# Patient Record
Sex: Male | Born: 1990 | Race: White | Hispanic: Yes | Marital: Married | Smoking: Never smoker
Health system: Southern US, Community
[De-identification: ages and names within clinical notes are randomized; demographics above are authoritative.]

## PROBLEM LIST (undated history)

## (undated) DIAGNOSIS — C8590 Non-Hodgkin lymphoma, unspecified, unspecified site: Secondary | ICD-10-CM

## (undated) HISTORY — PX: NECK SURGERY: SHX720

---

## 2017-08-18 ENCOUNTER — Emergency Department
Admission: EM | Admit: 2017-08-18 | Discharge: 2017-08-18 | Disposition: A | Discharge status: Home / Self Care | Payer: No Typology Code available for payment source | Attending: Emergency Medicine | Admitting: Emergency Medicine

## 2017-08-18 ENCOUNTER — Emergency Department: Payer: No Typology Code available for payment source

## 2017-08-18 ENCOUNTER — Other Ambulatory Visit: Payer: Self-pay

## 2017-08-18 ENCOUNTER — Encounter: Payer: Self-pay | Admitting: Emergency Medicine

## 2017-08-18 DIAGNOSIS — Y9389 Activity, other specified: Secondary | ICD-10-CM | POA: Diagnosis not present

## 2017-08-18 DIAGNOSIS — Y92410 Unspecified street and highway as the place of occurrence of the external cause: Secondary | ICD-10-CM | POA: Insufficient documentation

## 2017-08-18 DIAGNOSIS — R0789 Other chest pain: Secondary | ICD-10-CM | POA: Insufficient documentation

## 2017-08-18 DIAGNOSIS — M25512 Pain in left shoulder: Secondary | ICD-10-CM | POA: Insufficient documentation

## 2017-08-18 DIAGNOSIS — Y999 Unspecified external cause status: Secondary | ICD-10-CM | POA: Diagnosis not present

## 2017-08-18 DIAGNOSIS — S199XXA Unspecified injury of neck, initial encounter: Secondary | ICD-10-CM | POA: Diagnosis present

## 2017-08-18 DIAGNOSIS — M25522 Pain in left elbow: Secondary | ICD-10-CM | POA: Insufficient documentation

## 2017-08-18 DIAGNOSIS — C8590 Non-Hodgkin lymphoma, unspecified, unspecified site: Secondary | ICD-10-CM | POA: Insufficient documentation

## 2017-08-18 DIAGNOSIS — S161XXA Strain of muscle, fascia and tendon at neck level, initial encounter: Secondary | ICD-10-CM | POA: Diagnosis not present

## 2017-08-18 HISTORY — DX: Non-Hodgkin lymphoma, unspecified, unspecified site: C85.90

## 2017-08-18 MED ORDER — DIAZEPAM 5 MG PO TABS: 5.0000 mg | ORAL_TABLET | Freq: Three times a day (TID) | ORAL | 0 refills | Status: AC | PRN

## 2017-08-18 MED ORDER — OXYCODONE-ACETAMINOPHEN 5-325 MG PO TABS
1.0000 | ORAL_TABLET | Freq: Once | ORAL | Status: AC
  Administered 2017-08-18: 1 via ORAL
  Filled 2017-08-18: qty 1

## 2017-08-18 MED ORDER — IBUPROFEN 800 MG PO TABS: 800.0000 mg | ORAL_TABLET | Freq: Three times a day (TID) | ORAL | 0 refills | Status: DC | PRN

## 2017-08-18 MED ORDER — KETOROLAC TROMETHAMINE 60 MG/2ML IM SOLN
60.0000 mg | Freq: Once | INTRAMUSCULAR | Status: AC
  Administered 2017-08-18: 60 mg via INTRAMUSCULAR
  Filled 2017-08-18: qty 2

## 2017-08-18 MED ORDER — DIAZEPAM 2 MG PO TABS
2.0000 mg | ORAL_TABLET | Freq: Once | ORAL | Status: AC
  Administered 2017-08-18: 2 mg via ORAL
  Filled 2017-08-18: qty 1

## 2017-08-18 NOTE — ED Notes (Signed)
Patient able to ambulate to wheelchair without assistance. NAD and steady noted. Patient verbalized understanding of discharge instructions and follow-up care.
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
## 2017-08-18 NOTE — Discharge Instructions (Addendum)
You may take Tylenol or Motrin for mild to moderate pain.  Valium is for severe muscle spasms.  Do not drive within 8 hours of taking Valium.  Your sling for comfort, but make sure you take your shoulder and elbow out of the sling and move them around at least once per hour to prevent frozen shoulder.  Return to the emergency department for severe pain, vomiting, numbness tingling or weakness, or any other symptoms concerning to you.
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
## 2017-09-06 ENCOUNTER — Other Ambulatory Visit: Payer: Self-pay | Admitting: Orthopedic Surgery

## 2017-09-06 DIAGNOSIS — M5412 Radiculopathy, cervical region: Secondary | ICD-10-CM

## 2017-09-22 ENCOUNTER — Ambulatory Visit
Admission: RE | Admit: 2017-09-22 | Discharge: 2017-09-22 | Disposition: A | Discharge status: Home / Self Care | Payer: No Typology Code available for payment source | Source: Ambulatory Visit | Attending: Orthopedic Surgery | Admitting: Orthopedic Surgery

## 2017-09-22 DIAGNOSIS — M5412 Radiculopathy, cervical region: Secondary | ICD-10-CM | POA: Insufficient documentation

## 2017-09-22 DIAGNOSIS — M50222 Other cervical disc displacement at C5-C6 level: Secondary | ICD-10-CM | POA: Diagnosis not present

## 2019-03-20 ENCOUNTER — Other Ambulatory Visit: Payer: Self-pay

## 2019-03-20 DIAGNOSIS — Z20822 Contact with and (suspected) exposure to covid-19: Secondary | ICD-10-CM

## 2019-03-21 LAB — NOVEL CORONAVIRUS, NAA: SARS-CoV-2, NAA: NOT DETECTED
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
## 2021-08-24 ENCOUNTER — Other Ambulatory Visit: Payer: Self-pay

## 2021-08-24 ENCOUNTER — Encounter: Payer: Self-pay | Admitting: Emergency Medicine

## 2021-08-24 ENCOUNTER — Ambulatory Visit
Admission: EM | Admit: 2021-08-24 | Discharge: 2021-08-24 | Disposition: A | Discharge status: Home / Self Care | Payer: 59 | Attending: Emergency Medicine | Admitting: Emergency Medicine

## 2021-08-24 DIAGNOSIS — B349 Viral infection, unspecified: Secondary | ICD-10-CM

## 2021-08-24 LAB — POCT INFLUENZA A/B
Influenza A, POC: NEGATIVE
Influenza B, POC: NEGATIVE

## 2021-08-24 MED ORDER — IBUPROFEN 600 MG PO TABS: 600.0000 mg | ORAL_TABLET | Freq: Four times a day (QID) | ORAL | 0 refills | Status: DC | PRN

## 2021-08-24 MED ORDER — BENZONATATE 100 MG PO CAPS: 100.0000 mg | ORAL_CAPSULE | Freq: Three times a day (TID) | ORAL | 0 refills | Status: DC | PRN

## 2021-08-24 NOTE — ED Provider Notes (Signed)
[facility]    CSN: [id number] Arrival date & time: 08/24/21  1209      History   Chief Complaint Chief Complaint  Patient presents with   Nasal Congestion   Cough   Generalized Body Aches    HPI Marc Owens is a 31 y.o. male.  Patient presents with 3 day history of body aches, congestion, postnasal drip, runny nose, cough.  He also reports mild diarrhea.  He denies fever, shortness of breath, vomiting, or other symptoms.  Treatment at home with Robitussin.  His medical history includes non-hodgkin lymphoma.    The history is provided by the patient.   Past Medical History:  Diagnosis Date   Non-Hodgkin lymphoma ([place])     There are no problems to display for this patient.   Past Surgical History:  Procedure Laterality Date   NECK SURGERY         Home Medications    Prior to Admission medications   Medication Sig Start Date End Date Taking? Authorizing Provider  benzonatate (TESSALON) 100 MG capsule Take 1 capsule (100 mg total) by mouth 3 (three) times daily as needed for cough. 08/24/21  Yes Sharion Balloon, NP  ibuprofen (ADVIL) 600 MG tablet Take 1 tablet (600 mg total) by mouth every 6 (six) hours as needed. 08/24/21  Yes Sharion Balloon, NP    Family History History reviewed. No pertinent family history.  Social History Social History   Tobacco Use   Smoking status: Never  Substance Use Topics   Alcohol use: Yes    Comment: occasional   Drug use: No     Allergies   Patient has no known allergies.   Review of Systems Review of Systems  Constitutional:  Negative for chills and fever.  HENT:  Positive for congestion, postnasal drip, rhinorrhea and sinus pressure. Negative for ear pain and sore throat.   Respiratory:  Positive for cough. Negative for shortness of breath.   Cardiovascular:  Negative for chest pain and palpitations.  Gastrointestinal:  Negative for diarrhea and vomiting.  Skin:  Negative for color change and rash.   Neurological:  Negative for syncope.  All other systems reviewed and are negative.   Physical Exam Triage Vital Signs ED Triage Vitals  Enc Vitals Group     BP 08/24/21 1320 129/83     Pulse Rate 08/24/21 1320 95     Resp 08/24/21 1320 18     Temp 08/24/21 1320 98.2 F (36.8 C)     Temp Source 08/24/21 1320 Oral     SpO2 08/24/21 1320 98 %     Weight --      Height --      Head Circumference --      Peak Flow --      Pain Score 08/24/21 1331 5     Pain Loc --      Pain Edu? --      Excl. in [place]? --    No data found.  Updated Vital Signs BP 129/83 (BP Location: Left Arm)    Pulse 95    Temp 98.2 F (36.8 C) (Oral)    Resp 18    SpO2 98%   Visual Acuity Right Eye Distance:   Left Eye Distance:   Bilateral Distance:    Right Eye Near:   Left Eye Near:    Bilateral Near:     Physical Exam Vitals and nursing note reviewed.  Constitutional:  General: He is not in acute distress.    Appearance: He is well-developed.  HENT:     Right Ear: Tympanic membrane normal.     Left Ear: Tympanic membrane normal.     Nose: Congestion and rhinorrhea present.     Mouth/Throat:     Mouth: Mucous membranes are moist.     Pharynx: Oropharynx is clear.  Cardiovascular:     Rate and Rhythm: Normal rate and regular rhythm.     Heart sounds: Normal heart sounds.  Pulmonary:     Effort: Pulmonary effort is normal. No respiratory distress.     Breath sounds: Normal breath sounds.  Abdominal:     Palpations: Abdomen is soft.     Tenderness: There is no abdominal tenderness.  Musculoskeletal:     Cervical back: Neck supple.  Skin:    General: Skin is warm and dry.  Neurological:     Mental Status: He is alert.  Psychiatric:        Mood and Affect: Mood normal.        Behavior: Behavior normal.     UC Treatments / Results  Labs (all labs ordered are listed, but only abnormal results are displayed) Labs Reviewed  NOVEL CORONAVIRUS, NAA  POCT INFLUENZA A/B     EKG   Radiology No results found.  Procedures Procedures (including critical care time)  Medications Ordered in UC Medications - No data to display  Initial Impression / Assessment and Plan / UC Course  I have reviewed the triage vital signs and the nursing notes.  Pertinent labs & imaging results that were available during my care of the patient were reviewed by me and considered in my medical decision making (see chart for details).    Viral illness.  Rapid flu negative.  COVID pending.  Instructed patient to self quarantine per CDC guidelines.  Discussed symptomatic treatment including Tessalon Perles, ibuprofen, rest, hydration.  Instructed patient to follow up with PCP if symptoms are not improving.  Patient agrees to plan of care.   Final Clinical Impressions(s) / UC Diagnoses   Final diagnoses:  Viral illness     Discharge Instructions      Your flu test is negative.  Your COVID test is pending.  You should self quarantine until the test result is back.    Take ibuprofen and Tessalon Perles as directed.  Rest and keep yourself hydrated.    Follow-up with your primary care provider if your symptoms are not improving.         ED Prescriptions     Medication Sig Dispense Auth. Provider   benzonatate (TESSALON) 100 MG capsule Take 1 capsule (100 mg total) by mouth 3 (three) times daily as needed for cough. 21 capsule Barkley Boards H, NP   ibuprofen (ADVIL) 600 MG tablet Take 1 tablet (600 mg total) by mouth every 6 (six) hours as needed. 30 tablet Sharion Balloon, NP      PDMP not reviewed this encounter.   Sharion Balloon, NP 08/24/21 1423

## 2021-08-24 NOTE — ED Triage Notes (Signed)
Pt here with congestion, cough and body aches x 3 days. Denies fever.

## 2021-08-24 NOTE — Discharge Instructions (Addendum)
Your flu test is negative.  Your COVID test is pending.  You should self quarantine until the test result is back.    Take ibuprofen and Tessalon Perles as directed.  Rest and keep yourself hydrated.    Follow-up with your primary care provider if your symptoms are not improving.

## 2021-08-25 LAB — SARS-COV-2, NAA 2 DAY TAT

## 2021-08-25 LAB — NOVEL CORONAVIRUS, NAA: SARS-CoV-2, NAA: NOT DETECTED

## 2023-11-27 ENCOUNTER — Emergency Department
Admission: EM | Admit: 2023-11-27 | Discharge: 2023-11-27 | Disposition: A | Discharge status: Home / Self Care | Attending: Emergency Medicine | Admitting: Emergency Medicine

## 2023-11-27 ENCOUNTER — Other Ambulatory Visit: Payer: Self-pay

## 2023-11-27 DIAGNOSIS — R509 Fever, unspecified: Secondary | ICD-10-CM | POA: Diagnosis present

## 2023-11-27 DIAGNOSIS — B349 Viral infection, unspecified: Secondary | ICD-10-CM | POA: Diagnosis not present

## 2023-11-27 LAB — RESP PANEL BY RT-PCR (RSV, FLU A&B, COVID)  RVPGX2
Influenza A by PCR: NEGATIVE
Influenza B by PCR: NEGATIVE
Resp Syncytial Virus by PCR: NEGATIVE
SARS Coronavirus 2 by RT PCR: NEGATIVE

## 2023-11-27 MED ORDER — BENZONATATE 100 MG PO CAPS: 100.0000 mg | ORAL_CAPSULE | Freq: Three times a day (TID) | ORAL | 0 refills | Status: AC | PRN

## 2023-11-27 NOTE — ED Triage Notes (Signed)
 Pt to ed from home via POV for flu like symptoms. Pt just got back from [place] Last week. He has body aches, chills, fever. Pt states "I have had all of my vaccines". Pt is caox4, in no acute distress and ambulatory in triage,

## 2023-11-27 NOTE — ED Provider Notes (Signed)
 [facility] Provider Note    Event Date/Time   First MD Initiated Contact with Patient 11/27/23 1722     (approximate)   History   Flu Like symptoms   HPI  Marc Owens is a 33 y.o. male with PMH of non-Hodgkin lymphoma presents for evaluation of flu-like symptoms that began about 4 days ago.  Patient reports that he was recently in [place] for the funeral of a family member.  He states that his symptoms began after returning home.  He endorses cough, congestion, body aches and fever.  Patient's been taking Tylenol  at home to manage his symptoms.      Physical Exam   Triage Vital Signs: ED Triage Vitals [11/27/23 1708]  Encounter Vitals Group     BP 132/84     Systolic BP Percentile      Diastolic BP Percentile      Pulse Rate 95     Resp 18     Temp 99.1 F (37.3 C)     Temp Source Oral     SpO2 100 %     Weight      Height 5\' 5"  (1.651 m)     Head Circumference      Peak Flow      Pain Score 0     Pain Loc      Pain Education      Exclude from Growth Chart     Most recent vital signs: Vitals:   11/27/23 1708  BP: 132/84  Pulse: 95  Resp: 18  Temp: 99.1 F (37.3 C)  SpO2: 100%   General: Awake, no distress.  Does sound a bit congested. CV:  Good peripheral perfusion.  RRR. Resp:  Normal effort.  CTAB. Abd:  No distention.  Other:  Oral mucous membranes are moist, no pharyngeal erythema no tonsillar enlargement or exudates.   ED Results / Procedures / Treatments   Labs (all labs ordered are listed, but only abnormal results are displayed) Labs Reviewed  RESP PANEL BY RT-PCR (RSV, FLU A&B, COVID)  [id number]   PROCEDURES:  Critical Care performed: No  Procedures   MEDICATIONS ORDERED IN ED: Medications - No data to display   IMPRESSION / MDM / ASSESSMENT AND PLAN / ED COURSE  I reviewed the triage vital signs and the nursing notes.                             33 year old male presents for evaluation of  flulike symptoms.  Vital signs are stable and patient NAD on exam.  Differential diagnosis includes, but is not limited to, to, COVID, RSV, other viral illness.  Patient's presentation is most consistent with acute complicated illness / injury requiring diagnostic workup.  Patient tested negative for flu, covid and rsv. Did consider illnesses endemic to [place] like malaria and yellow fever, but patient states he received vaccines for these prior to his travel.   Patient's symptoms are consistent with a viral illness.  Discussed symptomatic management with him.  Recommended over-the-counter cold medicines as well as Tylenol  and ibuprofen  as needed. Given a note for work. We discussed return precautions.  He voiced understanding, all questions were answered and he was stable at discharge.      FINAL CLINICAL IMPRESSION(S) / ED DIAGNOSES   Final diagnoses:  Viral infection     Rx / DC Orders   ED Discharge Orders  Ordered    benzonatate  (TESSALON ) 100 MG capsule  3 times daily PRN        11/27/23 1918             Note:  This document was prepared using Dragon voice recognition software and may include unintentional dictation errors.   Phyliss Breen, PA-C 11/27/23 1925    Lind Repine, MD 11/29/23 [phone]

## 2023-11-27 NOTE — Discharge Instructions (Signed)
 You can take the tessalon  perles as needed for cough.  I believe you have a viral illness which will resolve on its own with time.  You do not need an antibiotic.  You can take over-the-counter cold medicine as needed to manage your symptoms. I recommend something like DayQuil or Theraflu. If you are taking combination cold medicine keep in mind that this often contains Tylenol  so if you need additional medication for body aches or fever control please take Motrin  or ibuprofen .  Your symptoms should resolve with time, if you have had symptoms for greater than 10 days please be evaluated by another healthcare provider as at this point it may have developed into a bacterial infection which requires a different treatment.  Return to the emergency department with worsening symptoms.

## 2024-06-08 ENCOUNTER — Ambulatory Visit
Admission: EM | Admit: 2024-06-08 | Discharge: 2024-06-08 | Disposition: A | Discharge status: Home / Self Care | Attending: Emergency Medicine | Admitting: Emergency Medicine

## 2024-06-08 ENCOUNTER — Encounter: Payer: Self-pay | Admitting: Emergency Medicine

## 2024-06-08 DIAGNOSIS — J069 Acute upper respiratory infection, unspecified: Secondary | ICD-10-CM

## 2024-06-08 MED ORDER — PROMETHAZINE-DM 6.25-15 MG/5ML PO SYRP: 5.0000 mL | ORAL_SOLUTION | Freq: Every evening | ORAL | 0 refills | Status: AC | PRN

## 2024-06-08 MED ORDER — BENZONATATE 100 MG PO CAPS: 100.0000 mg | ORAL_CAPSULE | Freq: Three times a day (TID) | ORAL | 0 refills | Status: AC

## 2024-06-08 MED ORDER — AZITHROMYCIN 250 MG PO TABS: 250.0000 mg | ORAL_TABLET | Freq: Every day | ORAL | 0 refills | Status: AC

## 2024-06-08 NOTE — ED Provider Notes (Signed)
 Marc Owens    CSN: [id number] Arrival date & time: 06/08/24  1632      History   Chief Complaint Chief Complaint  Patient presents with   Generalized Body Aches   Cough   Hoarse    HPI Marc Owens is a 33 y.o. male.   Patient presents for evaluation of a persisting cough present for 7 days.  Over the past week has had 1 occurrence of bilateral ear pain which have resolved, has been experiencing sinus pain and pressure to the bridge of the nose only in the mornings.  Over the past 3 days has become hoarse but denies sore throat.  Known sick contact but has different symptoms.  Endorses that he occasionally works outside and he feels this exacerbated symptoms.  Has not attempted treatment.  Denies fever, shortness of breath or wheezing.      Past Medical History:  Diagnosis Date   Non-Hodgkin lymphoma (HCC)     There are no active problems to display for this patient.   Past Surgical History:  Procedure Laterality Date   NECK SURGERY         Home Medications    Prior to Admission medications   Medication Sig Start Date End Date Taking? Authorizing Provider  ibuprofen  (ADVIL ) 600 MG tablet Take 1 tablet (600 mg total) by mouth every 6 (six) hours as needed. 08/24/21   Corlis Burnard DEL, NP    Family History History reviewed. No pertinent family history.  Social History Social History   Tobacco Use   Smoking status: Never  Substance Use Topics   Alcohol use: Yes    Comment: occasional   Drug use: No     Allergies   Patient has no known allergies.   Review of Systems Review of Systems  HENT:  Positive for ear pain, sinus pressure, sinus pain and voice change. Negative for congestion, dental problem, drooling, ear discharge, facial swelling, hearing loss, mouth sores, nosebleeds, postnasal drip, rhinorrhea, sneezing, sore throat, tinnitus and trouble swallowing.   Respiratory:  Positive for cough. Negative for apnea, choking, chest  tightness, shortness of breath, wheezing and stridor.   Cardiovascular: Negative.   Gastrointestinal: Negative.   Skin: Negative.   Neurological: Negative.      Physical Exam Triage Vital Signs ED Triage Vitals  Encounter Vitals Group     BP 06/08/24 1735 113/77     Girls Systolic BP Percentile --      Girls Diastolic BP Percentile --      Boys Systolic BP Percentile --      Boys Diastolic BP Percentile --      Pulse Rate 06/08/24 1735 75     Resp 06/08/24 1735 20     Temp 06/08/24 1735 98 F (36.7 C)     Temp Source 06/08/24 1735 Oral     SpO2 06/08/24 1735 98 %     Weight --      Height --      Head Circumference --      Peak Flow --      Pain Score 06/08/24 1737 5     Pain Loc --      Pain Education --      Exclude from Growth Chart --    No data found.  Updated Vital Signs BP 113/77 (BP Location: Left Arm)   Pulse 75   Temp 98 F (36.7 C) (Oral)   Resp 20   SpO2 98%   Visual  Acuity Right Eye Distance:   Left Eye Distance:   Bilateral Distance:    Right Eye Near:   Left Eye Near:    Bilateral Near:     Physical Exam Constitutional:      Appearance: Normal appearance.  HENT:     Right Ear: Tympanic membrane, ear canal and external ear normal.     Left Ear: Tympanic membrane, ear canal and external ear normal.     Nose: Congestion present.     Mouth/Throat:     Pharynx: Posterior oropharyngeal erythema present. No oropharyngeal exudate.  Eyes:     Extraocular Movements: Extraocular movements intact.  Cardiovascular:     Rate and Rhythm: Normal rate and regular rhythm.     Pulses: Normal pulses.     Heart sounds: Normal heart sounds.  Pulmonary:     Effort: Pulmonary effort is normal.     Breath sounds: Normal breath sounds.  Neurological:     Mental Status: He is alert and oriented to person, place, and time. Mental status is at baseline.      UC Treatments / Results  Labs (all labs ordered are listed, but only abnormal results are  displayed) Labs Reviewed - No data to display  EKG   Radiology No results found.  Procedures Procedures (including critical care time)  Medications Ordered in UC Medications - No data to display  Initial Impression / Assessment and Plan / UC Course  I have reviewed the triage vital signs and the nursing notes.  Pertinent labs & imaging results that were available during my care of the patient were reviewed by me and considered in my medical decision making (see chart for details).    Acute uri  Patient is in no signs of distress nor toxic appearing.  Vital signs are stable.  Low suspicion for pneumonia, pneumothorax or bronchitis and therefore will defer imaging viral testing deferred due to timeline, symptoms present for 7 days without signs of resolution empirically placed on azithromycin additionally prescribed Tessalon  and Promethazine DM for management of cough.May use additional over-the-counter medications as needed for supportive care.  May follow-up with urgent care as needed if symptoms persist or worsen.  Note given.   Final Clinical Impressions(s) / UC Diagnoses   Final diagnoses:  None   Discharge Instructions   None    ED Prescriptions   None    PDMP not reviewed this encounter.   Teresa Shelba SAUNDERS, NP 06/08/24 [phone]

## 2024-06-08 NOTE — Discharge Instructions (Signed)
 Begin azithromycin which will treat any bacteria causing your symptoms  You may use Tessalon  pill every 8 hours as needed for coughing  You may take the cough syrup at bedtime if having difficulty sleeping because your cough is waking you up    You can take Tylenol  and/or Ibuprofen  as needed for fever reduction and pain relief.   For cough: honey 1/2 to 1 teaspoon (you can dilute the honey in water or another fluid).  You can also use guaifenesin and dextromethorphan for cough. You can use a humidifier for chest congestion and cough.  If you don't have a humidifier, you can sit in the bathroom with the hot shower running.      For sore throat: try warm salt water gargles, cepacol lozenges, throat spray, warm tea or water with lemon/honey, popsicles or ice, or OTC cold relief medicine for throat discomfort.   For congestion: take a daily anti-histamine like Zyrtec, Claritin, and a oral decongestant, such as pseudoephedrine.  You can also use Flonase 1-2 sprays in each nostril daily.   It is important to stay hydrated: drink plenty of fluids (water, gatorade/powerade/pedialyte, juices, or teas) to keep your throat moisturized and help further relieve irritation/discomfort.

## 2024-06-08 NOTE — ED Triage Notes (Signed)
 Patient reports dry cough, body aches x 1 week. Patient also reports that he lost his voice 3 days ago. Patient has not taken anything for symptoms. Patient rates pain 5/10.

## 2024-07-09 ENCOUNTER — Ambulatory Visit (INDEPENDENT_AMBULATORY_CARE_PROVIDER_SITE_OTHER)

## 2024-07-09 ENCOUNTER — Ambulatory Visit
Admission: EM | Admit: 2024-07-09 | Discharge: 2024-07-09 | Disposition: A | Discharge status: Home / Self Care | Attending: Emergency Medicine | Admitting: Emergency Medicine

## 2024-07-09 DIAGNOSIS — M25511 Pain in right shoulder: Secondary | ICD-10-CM

## 2024-07-09 MED ORDER — IBUPROFEN 600 MG PO TABS: 600.0000 mg | ORAL_TABLET | Freq: Four times a day (QID) | ORAL | 0 refills | Status: AC | PRN

## 2024-07-09 NOTE — ED Provider Notes (Signed)
 Marc Owens    CSN: [id number] Arrival date & time: 07/09/24  1322      History   Chief Complaint Chief Complaint  Patient presents with   Shoulder Pain    HPI Marc Owens is a 33 y.o. male.  Patient presents with 5-day history of pain in his right shoulder which has been getting worse.  His pain started after he was lifting and moving heavy boxes at work.  No falls or specific trauma.  As he works each day, he has noticed increased pain with lifting and moving.  He has started to hear the joint pop with movement.  No swelling, bruising, rash, wounds, erythema, numbness, weakness, paresthesias.  He took ibuprofen  yesterday but no OTC medications taken today.  He reports no history of issues with this shoulder in the past.  The history is provided by the patient and medical records.    Past Medical History:  Diagnosis Date   Non-Hodgkin lymphoma (HCC)     There are no active problems to display for this patient.   Past Surgical History:  Procedure Laterality Date   NECK SURGERY         Home Medications    Prior to Admission medications  Medication Sig Start Date End Date Taking? Authorizing Provider  ibuprofen  (ADVIL ) 600 MG tablet Take 1 tablet (600 mg total) by mouth every 6 (six) hours as needed. 07/09/24  Yes Corlis Burnard DEL, NP  azithromycin  (ZITHROMAX ) 250 MG tablet Take 1 tablet (250 mg total) by mouth daily. Take first 2 tablets together, then 1 every day until finished. Patient not taking: Reported on 07/09/2024 06/08/24   Teresa Shelba SAUNDERS, NP  benzonatate  (TESSALON ) 100 MG capsule Take 1 capsule (100 mg total) by mouth every 8 (eight) hours. Patient not taking: Reported on 07/09/2024 06/08/24   Teresa Shelba SAUNDERS, NP  promethazine -dextromethorphan (PROMETHAZINE -DM) 6.25-15 MG/5ML syrup Take 5 mLs by mouth at bedtime as needed. Patient not taking: Reported on 07/09/2024 06/08/24   Teresa Shelba SAUNDERS, NP    Family History History reviewed.  No pertinent family history.  Social History Social History[1]   Allergies   Patient has no known allergies.   Review of Systems Review of Systems  Constitutional:  Negative for chills and fever.  Musculoskeletal:  Positive for arthralgias. Negative for joint swelling.  Skin:  Negative for color change, rash and wound.  Neurological:  Negative for weakness and numbness.     Physical Exam Triage Vital Signs ED Triage Vitals  Encounter Vitals Group     BP 07/09/24 1421 120/80     Girls Systolic BP Percentile --      Girls Diastolic BP Percentile --      Boys Systolic BP Percentile --      Boys Diastolic BP Percentile --      Pulse Rate 07/09/24 1421 76     Resp 07/09/24 1421 18     Temp 07/09/24 1421 97.9 F (36.6 C)     Temp Source 07/09/24 1421 Temporal     SpO2 07/09/24 1421 99 %     Weight --      Height --      Head Circumference --      Peak Flow --      Pain Score 07/09/24 1418 5     Pain Loc --      Pain Education --      Exclude from Growth Chart --    No data found.  Updated Vital Signs BP 120/80 (BP Location: Right Arm)   Pulse 76   Temp 97.9 F (36.6 C) (Temporal)   Resp 18   SpO2 99%   Visual Acuity Right Eye Distance:   Left Eye Distance:   Bilateral Distance:    Right Eye Near:   Left Eye Near:    Bilateral Near:     Physical Exam Constitutional:      General: He is not in acute distress. HENT:     Mouth/Throat:     Mouth: Mucous membranes are moist.  Cardiovascular:     Rate and Rhythm: Normal rate.  Pulmonary:     Effort: Pulmonary effort is normal. No respiratory distress.  Musculoskeletal:        General: Tenderness present. No swelling or deformity.     Comments: Right shoulder has limited ROM due to discomfort and has mild generalized tenderness to palpation. RUE: Sensation intact, strength 5/5, 2+ radial pulse, strong handgrip.  Skin:    General: Skin is warm and dry.     Capillary Refill: Capillary refill takes less  than 2 seconds.     Findings: No bruising, erythema, lesion or rash.  Neurological:     Mental Status: He is alert.     Sensory: No sensory deficit.     Motor: No weakness.      UC Treatments / Results  Labs (all labs ordered are listed, but only abnormal results are displayed) Labs Reviewed - No data to display  EKG   Radiology DG Shoulder Right Result Date: 07/09/2024 EXAM: 1 VIEW(S) XRAY OF THE SHOULDER 07/09/2024 02:51:29 PM COMPARISON: None available. CLINICAL HISTORY: pain, pop FINDINGS: BONES AND JOINTS: Glenohumeral joint is normally aligned. No acute fracture. No malalignment. The [facility] joint is unremarkable. SOFT TISSUES: No abnormal calcifications. Visualized lung is unremarkable. IMPRESSION: 1. No acute osseous abnormality or dislocation identified. Electronically signed by: Morgane Naveau MD 07/09/2024 03:05 PM EST RP Workstation: [id number]    Procedures Procedures (including critical care time)  Medications Ordered in UC Medications - No data to display  Initial Impression / Assessment and Plan / UC Course  I have reviewed the triage vital signs and the nursing notes.  Pertinent labs & imaging results that were available during my care of the patient were reviewed by me and considered in my medical decision making (see chart for details).    Right shoulder pain. Xray negative.  No specific trauma but increasing pain over the last few days.  Treating today with ibuprofen , sling, rest.  Instructed patient to follow-up with an orthopedist.  Contact information for on-call Ortho provided.  ED precautions given.  Education provided on shoulder pain.  He agrees to plan of care.  Final Clinical Impressions(s) / UC Diagnoses   Final diagnoses:  Acute pain of right shoulder     Discharge Instructions      Take the ibuprofen  and wear the sling as directed.  Follow-up with an orthopedist such as the one listed below.     ED Prescriptions     Medication Sig  Dispense Auth. Provider   ibuprofen  (ADVIL ) 600 MG tablet Take 1 tablet (600 mg total) by mouth every 6 (six) hours as needed. 30 tablet Corlis Burnard DEL, NP      PDMP not reviewed this encounter.    [1]  Social History Tobacco Use   Smoking status: Never  Vaping Use   Vaping status: Never Used  Substance Use Topics   Alcohol use: Yes  Comment: occasional   Drug use: No     Corlis Burnard DEL, NP 07/09/24 1511

## 2024-07-09 NOTE — Discharge Instructions (Signed)
 Take the ibuprofen  and wear the sling as directed.  Follow-up with an orthopedist such as the one listed below.

## 2024-07-09 NOTE — ED Triage Notes (Signed)
 Pt reports being seen in UC for R shoulder pain. Pt reports having to do heavy lifting for work and has recently having pain. Pt reports hearing popping sound in shoulder. Pt reports pain is at 5/10 when sitting still, but increases with movement. Pt denies known injury to area.
